# Patient Record
Sex: Male | Born: 1995 | Race: Black or African American | Hispanic: No | Marital: Single | Smoking: Never smoker
Health system: Southern US, Community
[De-identification: ages and names within clinical notes are randomized; demographics above are authoritative.]

---
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
## 2019-07-27 ENCOUNTER — Ambulatory Visit (HOSPITAL_COMMUNITY)
Admission: EM | Admit: 2019-07-27 | Discharge: 2019-07-27 | Disposition: A | Discharge status: Home / Self Care | Payer: Self-pay | Attending: Internal Medicine | Admitting: Internal Medicine

## 2019-07-27 ENCOUNTER — Ambulatory Visit (INDEPENDENT_AMBULATORY_CARE_PROVIDER_SITE_OTHER): Payer: Self-pay

## 2019-07-27 ENCOUNTER — Other Ambulatory Visit: Payer: Self-pay

## 2019-07-27 ENCOUNTER — Encounter (HOSPITAL_COMMUNITY): Payer: Self-pay

## 2019-07-27 DIAGNOSIS — M79644 Pain in right finger(s): Secondary | ICD-10-CM

## 2019-07-27 DIAGNOSIS — S66110A Strain of flexor muscle, fascia and tendon of right index finger at wrist and hand level, initial encounter: Secondary | ICD-10-CM

## 2019-07-27 LAB — CBC
HCT: 39.8 % (ref 39.0–52.0)
Hemoglobin: 13.3 g/dL (ref 13.0–17.0)
MCH: 30.9 pg (ref 26.0–34.0)
MCHC: 33.4 g/dL (ref 30.0–36.0)
MCV: 92.3 fL (ref 80.0–100.0)
Platelets: 300 10*3/uL (ref 150–400)
RBC: 4.31 MIL/uL (ref 4.22–5.81)
RDW: 10.9 % — ABNORMAL LOW (ref 11.5–15.5)
WBC: 5.9 10*3/uL (ref 4.0–10.5)
nRBC: 0 % (ref 0.0–0.2)

## 2019-07-27 MED ORDER — IBUPROFEN 800 MG PO TABS: 800.0000 mg | ORAL_TABLET | Freq: Three times a day (TID) | ORAL | 0 refills | Status: DC

## 2019-07-27 MED ORDER — PREDNISONE 10 MG PO TABS: 60.0000 mg | ORAL_TABLET | Freq: Every day | ORAL | 0 refills | Status: AC

## 2019-07-27 NOTE — ED Triage Notes (Signed)
Pt c/o finger edema, pain since Sunday a.m. States woke up with condition. No history of injury to area, or insect bite. Redness noted to palm side of first index finger into palm area; edema, limited ROM to right index finger. +2 right radial pulse, <3 sec cap refill to fingers.  States h/o fractured 2nd phalange of same hand approx 5 years ago.

## 2019-07-27 NOTE — ED Provider Notes (Signed)
[facility]    CSN: [id number] Arrival date & time: 07/27/19  0827      History   Chief Complaint Chief Complaint  Patient presents with  . Finger Injury    HPI Aaron Goodwin is a 24 y.o. male.   Patient reports to urgent care today for 2 days of right index pain and swelling. He reports waking 2 days ago with right index pain and swelling that has progressed. He also notes redness. He reports limited ability to bend his finger due to swelling and pain. Reports the pain is most severe with movement. 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 He stayed out of work yesterday due to this. He denies fever, chills, rash. He has never had a similar event. He has had some relief with ice.   He does note some shoulder and hip pain in days preceding but these responded well to ibuprofen and are not longer an issue.      History reviewed. No pertinent past medical history.  There are no problems to display for this patient.   History reviewed. No pertinent surgical history.     Home Medications    Prior to Admission medications   Medication Sig Start Date End Date Taking? Authorizing Provider  amoxicillin (AMOXIL) 400 MG/5ML suspension Take 5 mLs (400 mg total) by mouth 3 (three) times daily. 04/06/14   Reuben Likes, MD  amoxicillin (AMOXIL) 500 MG capsule Take 1 capsule (500 mg total) by mouth 3 (three) times daily. 03/12/14   Linna Hoff, MD  ibuprofen (ADVIL) 800 MG tablet Take 1 tablet (800 mg total) by mouth 3 (three) times daily. 07/27/19   Ta Fair, Veryl Speak, PA-C  prednisoLONE (PRELONE) 15 MG/5ML syrup Take 2 tsp daily. 04/06/14   Reuben Likes, MD  predniSONE (DELTASONE) 10 MG tablet Take 6 tablets (60 mg total) by mouth daily for 5 days. 07/27/19 08/01/19  Maureen Delatte, Veryl Speak, PA-C    Family History Family History  Problem Relation Age of Onset  . Healthy Mother   . Healthy Father     Social History Social History    Tobacco Use  . Smoking status: Never Smoker  . Smokeless tobacco: Never Used  Substance Use Topics  . Alcohol use: No  . Drug use: No     Allergies   Patient has no known allergies.   Review of Systems Review of Systems  Constitutional: Negative for chills and fever.  HENT: Negative for sore throat.   Respiratory: Negative for cough and shortness of breath.   Cardiovascular: Negative for chest pain.  Genitourinary: Negative for dysuria and hematuria.  Musculoskeletal: Positive for arthralgias, joint swelling and myalgias. Negative for back pain.  Skin: Positive for color change. Negative for rash and wound.  Neurological: Negative for tremors, numbness and headaches.  All other systems reviewed and are negative.    Physical Exam Triage Vital Signs ED Triage Vitals  Enc Vitals Group     BP 07/27/19 0936 134/81     Pulse Rate 07/27/19 0936 78     Resp 07/27/19 0936 16     Temp 07/27/19 0936 98.7 F (37.1 C)     Temp Source 07/27/19 0936 Oral     SpO2 07/27/19 0936 98 %     Weight --      Height --      Head Circumference --      Peak Flow --  Pain Score 07/27/19 0932 8     Pain Loc --      Pain Edu? --      Excl. in GC? --    No data found.  Updated Vital Signs BP 134/81 (BP Location: Left Arm)   Pulse 78   Temp 98.7 F (37.1 C) (Oral)   Resp 16   SpO2 98%   Visual Acuity Right Eye Distance:   Left Eye Distance:   Bilateral Distance:    Right Eye Near:   Left Eye Near:    Bilateral Near:     Physical Exam Vitals and nursing note reviewed.  Constitutional:      General: He is not in acute distress.    Appearance: He is well-developed and normal weight. He is not ill-appearing.  HENT:     Head: Normocephalic and atraumatic.  Eyes:     Conjunctiva/sclera: Conjunctivae normal.     Pupils: Pupils are equal, round, and reactive to light.  Cardiovascular:     Rate and Rhythm: Normal rate.     Heart sounds: No murmur.  Pulmonary:      Effort: Pulmonary effort is normal. No respiratory distress.  Musculoskeletal:     Right hand: Swelling and tenderness present. Decreased range of motion.     Left hand: Normal.       Hands:     Cervical back: Normal range of motion and neck supple.     Right lower leg: No edema.     Left lower leg: No edema.  Skin:    General: Skin is warm and dry.     Capillary Refill: Capillary refill takes less than 2 seconds.  Neurological:     General: No focal deficit present.     Mental Status: He is alert and oriented to person, place, and time.  Psychiatric:        Mood and Affect: Mood normal.        Behavior: Behavior normal.        Thought Content: Thought content normal.        Judgment: Judgment normal.      UC Treatments / Results  Labs (all labs ordered are listed, but only abnormal results are displayed) Labs Reviewed  CBC - Abnormal; Notable for the following components:      Result Value   RDW 10.9 (*)    All other components within normal limits    EKG   Radiology DG Hand Complete Right  Result Date: 07/27/2019 CLINICAL DATA:  Right index finger pain radiating to the hand for 2 days. EXAM: RIGHT HAND - COMPLETE 3+ VIEW COMPARISON:  None. FINDINGS: There is no evidence of fracture or dislocation. There is no evidence of arthropathy or other focal bone abnormality including periostitis. Soft tissues are unremarkable. IMPRESSION: Negative. Electronically Signed   By: Marnee Spring M.D.   On: 07/27/2019 09:49    Procedures Procedures (including critical care time)  Medications Ordered in UC Medications - No data to display  Initial Impression / Assessment and Plan / UC Course  I have reviewed the triage vital signs and the nursing notes.  Pertinent labs & imaging results that were available during my care of the patient were reviewed by me and considered in my medical decision making (see chart for details).     #Flexor tendonitis of right index finger - CBC  without sign of infection. Imaging not suggestive of bone pathology or periosteal inflammation.  likely inflammatory process due to missed  injury during work. Will treat with NSAID and prednisone burst with 48 hour follow up if worsening or not improving. Signs of infection discussed.    Final Clinical Impressions(s) / UC Diagnoses   Final diagnoses:  Strain of flexor muscle, fascia and tendon of right index finger at wrist and hand level, initial encounter     Discharge Instructions     I have sent in a stronger ibuprofen for you, please take this 1 tablet every 8 hours for 3 days, and then as needed  I have sent in a steroid for you to take, please take 6 tablets daily for 5 days  Continue to ice your finger if you feel this helps. Rest as allowed and modify work responsibilities to the best of your ability.   It does not appears there is an infection at this time, but please monitor for worsening symptoms of redness, warmth, swelling or fever over the next 48 hours. if these occur or you are not improving, please follow up with your primary care or this Urgent care.      ED Prescriptions    Medication Sig Dispense Auth. Provider   ibuprofen (ADVIL) 800 MG tablet Take 1 tablet (800 mg total) by mouth 3 (three) times daily. 21 tablet Tabria Steines, Marguerita Beards, PA-C   predniSONE (DELTASONE) 10 MG tablet Take 6 tablets (60 mg total) by mouth daily for 5 days. 30 tablet Jatavion Peaster, Marguerita Beards, PA-C     PDMP not reviewed this encounter.   Purnell Shoemaker, PA-C 07/27/19 1114

## 2019-07-27 NOTE — Discharge Instructions (Addendum)
I have sent in a stronger ibuprofen for you, please take this 1 tablet every 8 hours for 3 days, and then as needed  I have sent in a steroid for you to take, please take 6 tablets daily for 5 days  Continue to ice your finger if you feel this helps. Rest as allowed and modify work responsibilities to the best of your ability.   It does not appears there is an infection at this time, but please monitor for worsening symptoms of redness, warmth, swelling or fever over the next 48 hours. if these occur or you are not improving, please follow up with your primary care or this Urgent care.

## 2019-07-31 ENCOUNTER — Telehealth (HOSPITAL_COMMUNITY): Payer: Self-pay | Admitting: *Deleted

## 2019-07-31 NOTE — Telephone Encounter (Signed)
Pt states he is almost finished with prednisone Rx and taking IBU Rx, but has absolutely no improvement in finger sxs.  Per pt's AVS from 07/26/19, pt was informed to return or follow-up with [facility] if any worsening or no improvement.  Recommended pt return to [facility] or may try orthopedic urgent care @ [facility] to have eval, as he needs another eval if no improvement.  Pt verbalized understanding.

## 2019-08-04 ENCOUNTER — Other Ambulatory Visit: Payer: Self-pay

## 2019-08-04 ENCOUNTER — Ambulatory Visit (HOSPITAL_COMMUNITY)
Admission: EM | Admit: 2019-08-04 | Discharge: 2019-08-04 | Disposition: A | Discharge status: Home / Self Care | Payer: Self-pay | Attending: Family Medicine | Admitting: Family Medicine

## 2019-08-04 ENCOUNTER — Encounter (HOSPITAL_COMMUNITY): Payer: Self-pay

## 2019-08-04 DIAGNOSIS — M79644 Pain in right finger(s): Secondary | ICD-10-CM

## 2019-08-04 MED ORDER — IBUPROFEN 800 MG PO TABS: 800.0000 mg | ORAL_TABLET | Freq: Three times a day (TID) | ORAL | 0 refills | Status: AC

## 2019-08-04 NOTE — ED Triage Notes (Signed)
Pt states he was here on the 5th of December. Pt states his finger ( index ) right hand is not getting better. Pt states he took the meds as directed.

## 2019-08-07 NOTE — ED Provider Notes (Signed)
[facility]   [id number] 08/04/19 Arrival Time: 1517  ASSESSMENT & PLAN:  1. Finger pain, right     Continue ibuprofen and work on ROM as he tolerates. Given duration of symptoms, I recommend f/u with hand specialist. Information given.   Meds ordered this encounter  Medications  . ibuprofen (ADVIL) 800 MG tablet    Sig: Take 1 tablet (800 mg total) by mouth 3 (three) times daily.    Dispense:  21 tablet    Refill:  0      Recommend: Follow-up Information    Schedule an appointment as soon as possible for a visit  with Bradly Bienenstock, MD.   Specialty: Orthopedic Surgery Contact information: [address] [place] [address] [phone]            Reviewed expectations re: course of current medical issues. Questions answered. Outlined signs and symptoms indicating need for more acute intervention. Patient verbalized understanding. After Visit Summary given.  SUBJECTIVE: History from: patient. Aaron Goodwin is a 24 y.o. male who reports persistent R index finger pain. Note from visit dated 07/27/2019 reviewed by me. Feels ibuprofen helped but still with swelling and decreased ROM. No extremity sensation changes or weakness. No new injury. No specific aggravating or alleviating factors reported. Having difficulty flexing finger. No skin changes. Afebrile.  History reviewed. No pertinent surgical history.   ROS: As per HPI. All other systems negative.    OBJECTIVE:  Vitals:   08/04/19 1556 08/04/19 1557  BP:  (!) 146/79  Pulse:  62  Resp:  16  Temp:  98.6 F (37 C)  TempSrc:  Oral  SpO2:  100%  Weight: 62.1 kg     General appearance: alert; no distress HEENT: [place]; AT Neck: supple with FROM Resp: unlabored respirations Extremities: . RUE:  Swelling and TTP over R finger; more proximally; decreased ROM secondary to swelling; normal distal sensation and capillary refill Skin: warm and dry; no visible rashes Neurologic: gait  normal; normal sensation and strength of RUE Psychological: alert and cooperative; normal mood and affect    No Known Allergies   Social History   Socioeconomic History  . Marital status: Single    Spouse name: Not on file  . Number of children: Not on file  . Years of education: Not on file  . Highest education level: Not on file  Occupational History  . Not on file  Tobacco Use  . Smoking status: Never Smoker  . Smokeless tobacco: Never Used  Substance and Sexual Activity  . Alcohol use: No  . Drug use: No  . Sexual activity: Not on file  Other Topics Concern  . Not on file  Social History Narrative  . Not on file   Social Determinants of Health   Financial Resource Strain:   . Difficulty of Paying Living Expenses: Not on file  Food Insecurity:   . Worried About Programme researcher, broadcasting/film/video in the Last Year: Not on file  . Ran Out of Food in the Last Year: Not on file  Transportation Needs:   . Lack of Transportation (Medical): Not on file  . Lack of Transportation (Non-Medical): Not on file  Physical Activity:   . Days of Exercise per Week: Not on file  . Minutes of Exercise per Session: Not on file  Stress:   . Feeling of Stress : Not on file  Social Connections:   . Frequency of Communication with Friends and Family: Not on file  .  Frequency of Social Gatherings with Friends and Family: Not on file  . Attends Religious Services: Not on file  . Active Member of Clubs or Organizations: Not on file  . Attends Archivist Meetings: Not on file  . Marital Status: Not on file   Family History  Problem Relation Age of Onset  . Healthy Mother   . Healthy Father    History reviewed. No pertinent surgical history.    Vanessa Kick, MD 08/07/19 [phone]

## 2021-01-04 IMAGING — DX DG HAND COMPLETE 3+V*R*
3 series · 3 of 3 positions shown · non-contrast
Comparison: None.

CLINICAL DATA: Right index finger pain radiating to the hand for 2
days.

EXAM:
RIGHT HAND - COMPLETE 3+ VIEW

[hand pa]
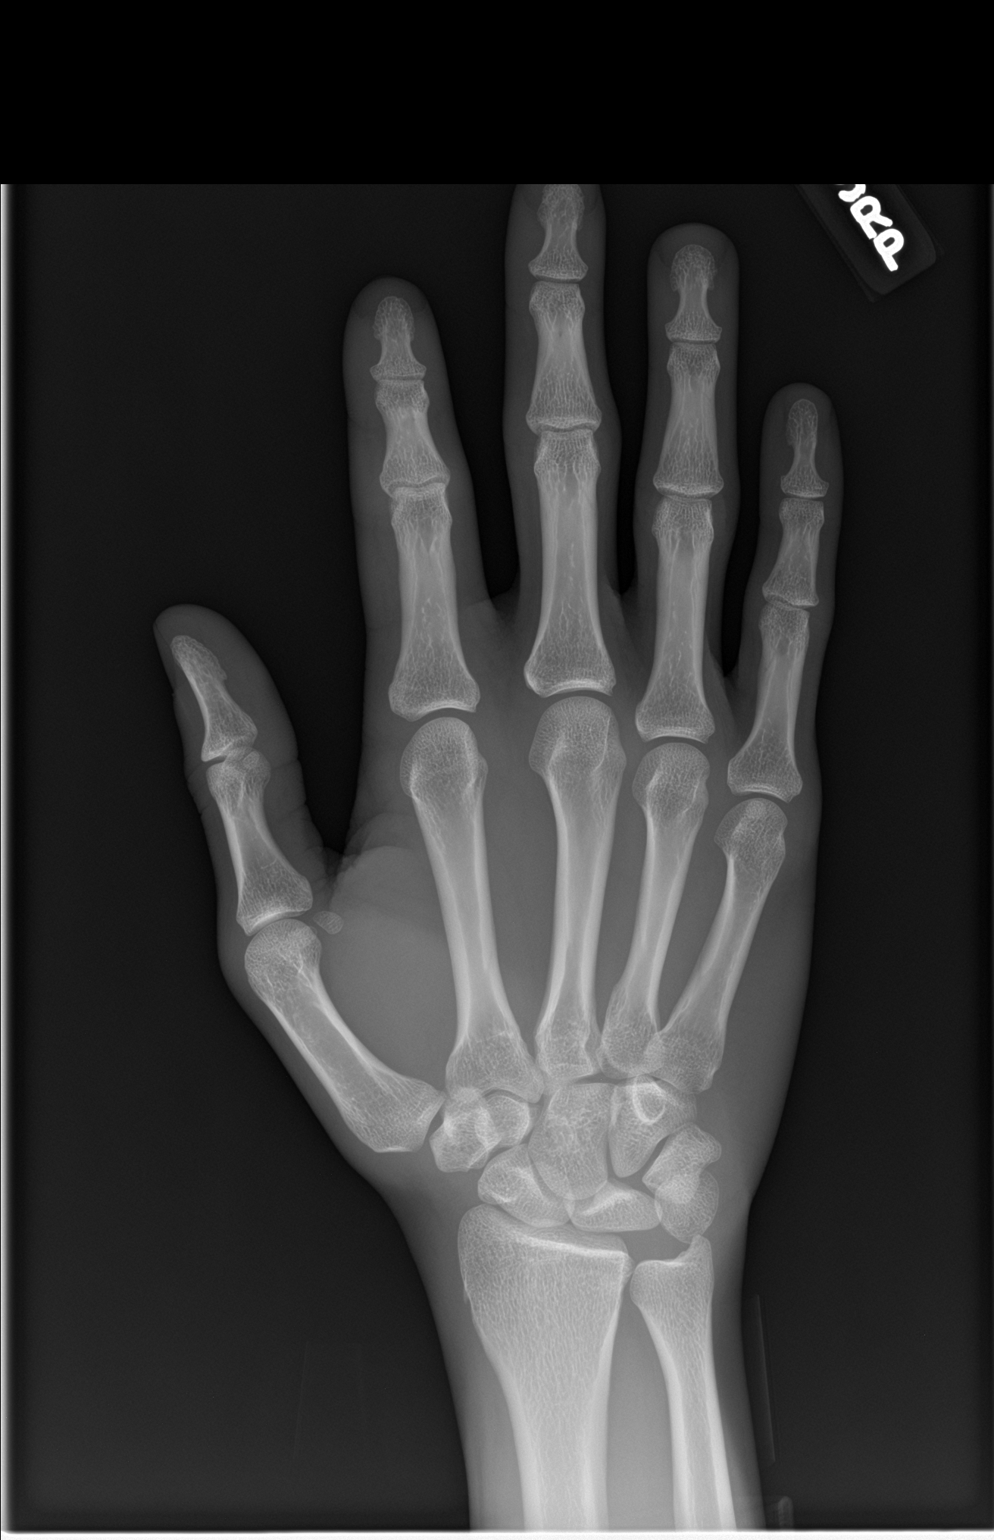

[hand obl]
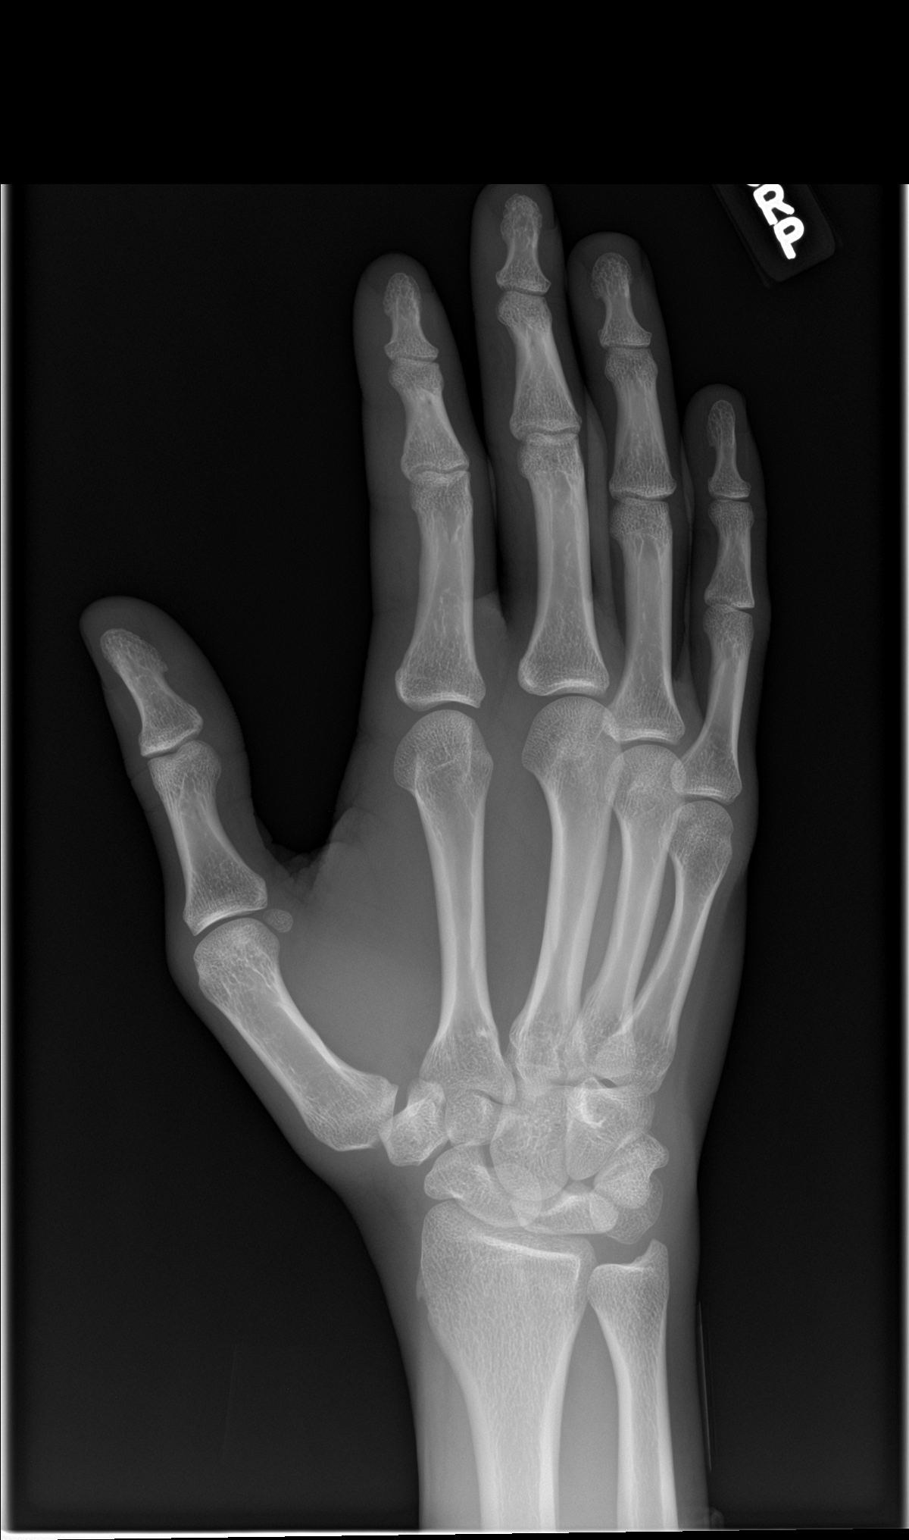

[hand lat]
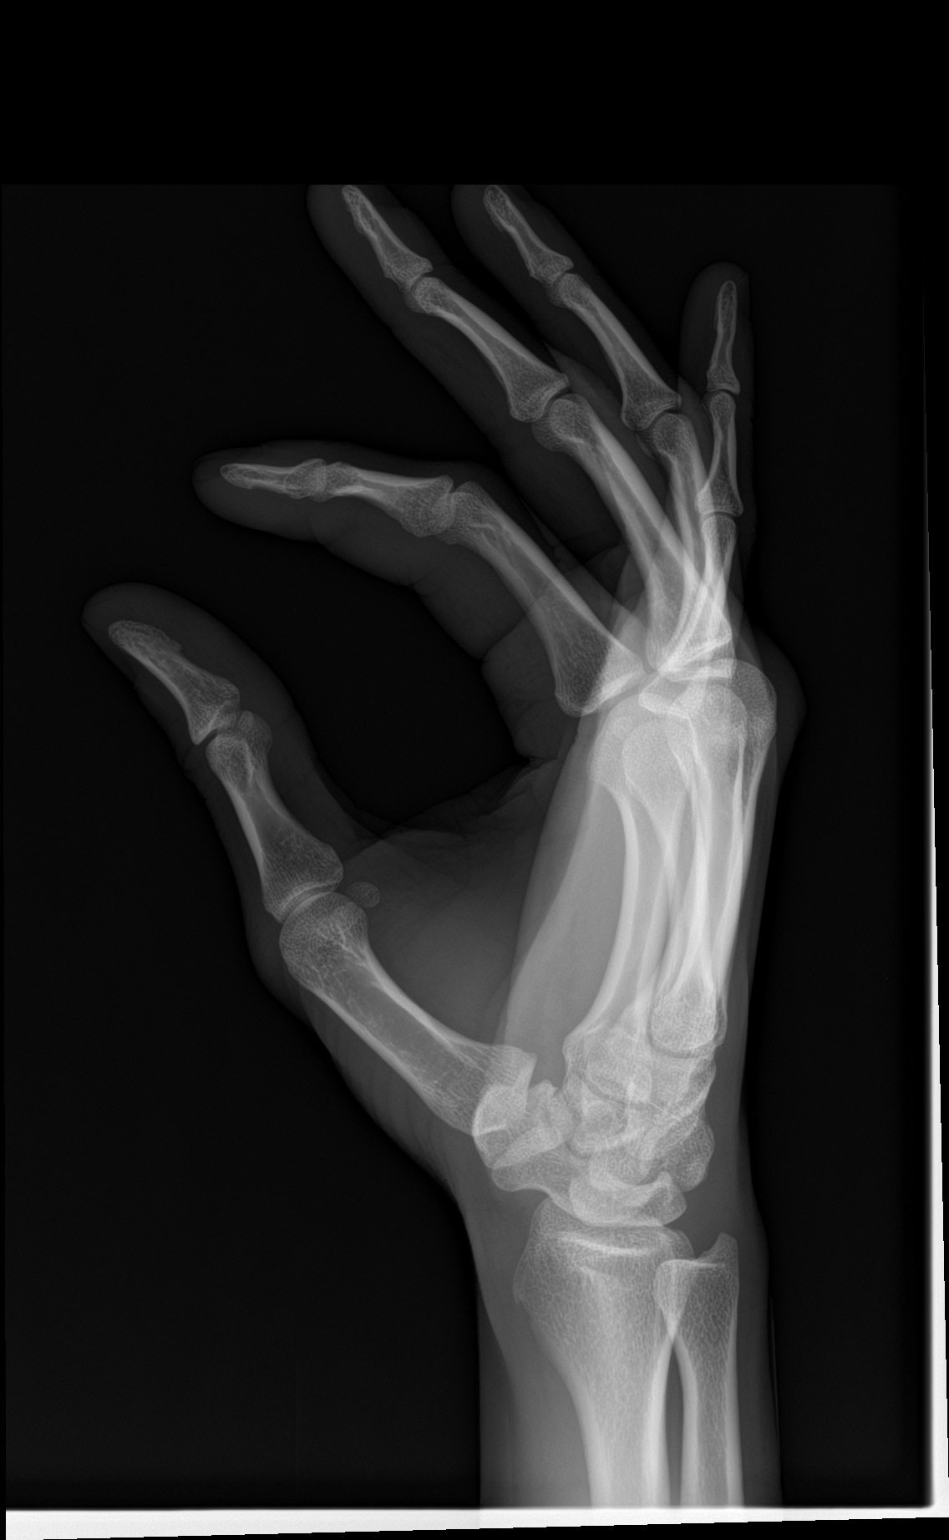

[3 of 3 positions shown; findings below may reference images not displayed]

FINDINGS: There is no evidence of fracture or dislocation. There is no
evidence of arthropathy or other focal bone abnormality including
periostitis. Soft tissues are unremarkable.
IMPRESSION: Negative.

## 2024-03-19 ENCOUNTER — Encounter (HOSPITAL_COMMUNITY): Payer: Self-pay

## 2024-03-19 ENCOUNTER — Ambulatory Visit (HOSPITAL_COMMUNITY)
Admission: RE | Admit: 2024-03-19 | Discharge: 2024-03-19 | Disposition: A | Discharge status: Home / Self Care | Payer: Self-pay | Source: Ambulatory Visit | Attending: Family Medicine | Admitting: Family Medicine

## 2024-03-19 VITALS — BP 120/73 | HR 72 | Temp 98.2°F | Resp 16

## 2024-03-19 DIAGNOSIS — M62838 Other muscle spasm: Secondary | ICD-10-CM | POA: Diagnosis not present

## 2024-03-19 MED ORDER — CYCLOBENZAPRINE HCL 10 MG PO TABS: 10.0000 mg | ORAL_TABLET | Freq: Two times a day (BID) | ORAL | 0 refills | Status: AC | PRN

## 2024-03-19 MED ORDER — NAPROXEN 500 MG PO TABS: 500.0000 mg | ORAL_TABLET | Freq: Two times a day (BID) | ORAL | 0 refills | Status: AC | PRN

## 2024-03-19 NOTE — Discharge Instructions (Signed)
 You may take naproxen  twice daily as needed.  You may also take Flexeril  twice daily as needed for your muscle pain.  Please note that Flexeril  make you drowsy.  Do not drink alcohol or drive on this medication.  Do heat to the neck and lots of rest.  Please follow-up with your PCP if your symptoms do not improve.  Please go to the ER for any worsening symptoms.  Feel better soon!

## 2024-03-19 NOTE — ED Provider Notes (Signed)
 [facility]    CSN: [id number] Arrival date & time: 03/19/24  1134      History   Chief Complaint Chief Complaint  Patient presents with   Back Pain   Neck Pain   Appt 1130    HPI Aaron Goodwin is a 28 y.o. male presents for neck pain.  Patient reports 5 days ago he awoke with left-sided neck pain that radiates into his left shoulder blade.  Reports it is worse with movement and turning of his head.  Denies any injury or inciting event.  Denies any numbness/tingling/weakness of his upper extremities, no headaches or fevers.  No history of injuries or surgeries to the neck in the past.  He has tried Advil  last dose 2 days ago with minimal improvement.  No other concerns at this time.    Back Pain Neck Pain   History reviewed. No pertinent past medical history.  There are no active problems to display for this patient.   History reviewed. No pertinent surgical history.     Home Medications    Prior to Admission medications   Medication Sig Start Date End Date Taking? Authorizing Provider  cyclobenzaprine  (FLEXERIL ) 10 MG tablet Take 1 tablet (10 mg total) by mouth 2 (two) times daily as needed for muscle spasms. 03/19/24  Yes Caral Whan, Jodi R, NP  naproxen  (NAPROSYN ) 500 MG tablet Take 1 tablet (500 mg total) by mouth 2 (two) times daily as needed. 03/19/24  Yes Dynasti Kerman, Jodi R, NP  amoxicillin  (AMOXIL ) 400 MG/5ML suspension Take 5 mLs (400 mg total) by mouth 3 (three) times daily. 04/06/14   Sonda Alm BROCKS, MD  amoxicillin  (AMOXIL ) 500 MG capsule Take 1 capsule (500 mg total) by mouth 3 (three) times daily. 03/12/14   Vincente Lynwood BIRCH, MD  ibuprofen  (ADVIL ) 800 MG tablet Take 1 tablet (800 mg total) by mouth 3 (three) times daily. 08/04/19   Rolinda Rogue, MD  prednisoLONE  (PRELONE ) 15 MG/5ML syrup Take 2 tsp daily. 04/06/14   Sonda Alm BROCKS, MD    Family History Family History  Problem Relation Age of Onset   Healthy Mother    Healthy Father     Social  History Social History   Tobacco Use   Smoking status: Never   Smokeless tobacco: Never  Vaping Use   Vaping status: Never Used  Substance Use Topics   Alcohol use: No   Drug use: No     Allergies   Patient has no known allergies.   Review of Systems Review of Systems  Musculoskeletal:  Positive for neck pain.     Physical Exam Triage Vital Signs ED Triage Vitals  Encounter Vitals Group     BP 03/19/24 1146 120/73     Girls Systolic BP Percentile --      Girls Diastolic BP Percentile --      Boys Systolic BP Percentile --      Boys Diastolic BP Percentile --      Pulse Rate 03/19/24 1146 72     Resp 03/19/24 1146 16     Temp 03/19/24 1146 98.2 F (36.8 C)     Temp Source 03/19/24 1146 Oral     SpO2 03/19/24 1146 97 %     Weight --      Height --      Head Circumference --      Peak Flow --      Pain Score 03/19/24 1147 10     Pain Loc --  Pain Education --      Exclude from Growth Chart --    No data found.  Updated Vital Signs BP 120/73   Pulse 72   Temp 98.2 F (36.8 C) (Oral)   Resp 16   SpO2 97%   Visual Acuity Right Eye Distance:   Left Eye Distance:   Bilateral Distance:    Right Eye Near:   Left Eye Near:    Bilateral Near:     Physical Exam Vitals and nursing note reviewed.  Constitutional:      General: He is not in acute distress.    Appearance: Normal appearance. He is not ill-appearing.  HENT:     Head: Normocephalic and atraumatic.  Eyes:     Pupils: Pupils are equal, round, and reactive to light.  Pulmonary:     Effort: Pulmonary effort is normal.  Musculoskeletal:     Cervical back: Tenderness present. No swelling, edema, deformity, erythema, signs of trauma, lacerations, rigidity, spasms, torticollis or bony tenderness. Pain with movement present. Normal range of motion.     Thoracic back: Spasms and tenderness present. No swelling, edema, deformity, signs of trauma, lacerations or bony tenderness. Normal range of  motion. No scoliosis.     Lumbar back: Normal.       Back:     Comments: Strength 5 out of 5 bilateral upper extremities  Skin:    General: Skin is warm and dry.  Neurological:     General: No focal deficit present.     Mental Status: He is alert and oriented to person, place, and time.  Psychiatric:        Mood and Affect: Mood normal.        Behavior: Behavior normal.      UC Treatments / Results  Labs (all labs ordered are listed, but only abnormal results are displayed) Labs Reviewed - No data to display  EKG   Radiology No results found.  Procedures Procedures (including critical care time)  Medications Ordered in UC Medications - No data to display  Initial Impression / Assessment and Plan / UC Course  I have reviewed the triage vital signs and the nursing notes.  Pertinent labs & imaging results that were available during my care of the patient were reviewed by me and considered in my medical decision making (see chart for details).     Reviewed exam and symptoms with patient.  No red flags.  Discussed muscle spasm.  He declined Toradol injection in clinic.  Will do Rx naproxen  and Flexeril , side effect profile reviewed.  Advised heat rest and PCP follow-up if symptoms do not improve.  ER precautions reviewed. Final Clinical Impressions(s) / UC Diagnoses   Final diagnoses:  Neck muscle spasm     Discharge Instructions      You may take naproxen  twice daily as needed.  You may also take Flexeril  twice daily as needed for your muscle pain.  Please note that Flexeril  make you drowsy.  Do not drink alcohol or drive on this medication.  Do heat to the neck and lots of rest.  Please follow-up with your PCP if your symptoms do not improve.  Please go to the ER for any worsening symptoms.  Feel better soon!    ED Prescriptions     Medication Sig Dispense Auth. Provider   cyclobenzaprine  (FLEXERIL ) 10 MG tablet Take 1 tablet (10 mg total) by mouth 2 (two)  times daily as needed for muscle spasms. 10 tablet Loreda,  Jodi R, NP   naproxen  (NAPROSYN ) 500 MG tablet Take 1 tablet (500 mg total) by mouth 2 (two) times daily as needed. 14 tablet Apple Dearmas, Jodi R, NP      PDMP not reviewed this encounter.   Loreda Myla SAUNDERS, NP 03/19/24 1159

## 2024-03-19 NOTE — ED Triage Notes (Signed)
 Pt reports waking up with left lateral neck pain radiating down into left upper and mid back, onset approx 5 days ago. Has tried Advil . C/O painful, limited ROM of neck due to pain. Denies parasthesias.
